# Patient Record
Sex: Male | Born: 1960 | Race: White | Hispanic: No | Marital: Married
Health system: Southern US, Community
[De-identification: ages and names within clinical notes are randomized; demographics above are authoritative.]

---

## 2020-10-31 ENCOUNTER — Emergency Department (HOSPITAL_COMMUNITY)
Admission: EM | Admit: 2020-10-31 | Discharge: 2020-10-31 | Disposition: A | Discharge status: Home / Self Care | Payer: Federal, State, Local not specified - PPO | Attending: Emergency Medicine | Admitting: Emergency Medicine

## 2020-10-31 ENCOUNTER — Other Ambulatory Visit: Payer: Self-pay

## 2020-10-31 ENCOUNTER — Emergency Department (HOSPITAL_COMMUNITY): Payer: Federal, State, Local not specified - PPO

## 2020-10-31 DIAGNOSIS — S99911A Unspecified injury of right ankle, initial encounter: Secondary | ICD-10-CM | POA: Diagnosis present

## 2020-10-31 DIAGNOSIS — S82241A Displaced spiral fracture of shaft of right tibia, initial encounter for closed fracture: Secondary | ICD-10-CM | POA: Diagnosis not present

## 2020-10-31 DIAGNOSIS — Y9301 Activity, walking, marching and hiking: Secondary | ICD-10-CM | POA: Diagnosis not present

## 2020-10-31 DIAGNOSIS — S89301A Unspecified physeal fracture of lower end of right fibula, initial encounter for closed fracture: Secondary | ICD-10-CM | POA: Diagnosis not present

## 2020-10-31 DIAGNOSIS — W541XXA Struck by dog, initial encounter: Secondary | ICD-10-CM | POA: Diagnosis not present

## 2020-10-31 DIAGNOSIS — S82831A Other fracture of upper and lower end of right fibula, initial encounter for closed fracture: Secondary | ICD-10-CM

## 2020-10-31 MED ORDER — OXYCODONE-ACETAMINOPHEN 5-325 MG PO TABS
1.0000 | ORAL_TABLET | Freq: Once | ORAL | Status: AC
  Administered 2020-10-31: 1 via ORAL
  Filled 2020-10-31: qty 1

## 2020-10-31 MED ORDER — OXYCODONE-ACETAMINOPHEN 5-325 MG PO TABS: 1.0000 | ORAL_TABLET | ORAL | 0 refills | Status: AC | PRN

## 2020-10-31 NOTE — ED Notes (Signed)
Pt resting in bed, wife at bedside. Respirations even and unlabored. Will continue to monitor.

## 2020-10-31 NOTE — Discharge Instructions (Addendum)
Follow-up with Dr. Jena Gauss.  Call his office Monday to get a close appointment.  Take prescribed pain medicine as needed.  This can make you drowsy and should not be taken while driving.  You should not have any weightbearing on your right leg.  Use crutches to get around.  Keep splint dry.  If you develop severe pain, numbness, skin color changes, return immediately to ER for reassessment.

## 2020-10-31 NOTE — ED Notes (Signed)
Ortho tech in room placing splint at this time

## 2020-10-31 NOTE — ED Provider Notes (Signed)
[facility]-EMERGENCY DEPT Provider Note   CSN: [id number] Arrival date & time: 10/31/20  1647     History Chief Complaint  Patient presents with  . Ankle Pain    Elijah Oconnell is a 59 y.o. male.  Presents to ER with concern for ankle injury.  Dog ran into him and knocked him over, twisted his right ankle.  Having excruciating pain in his right ankle, denies any other injury.  Sharp, stabbing pain.  No pain in knee or hip.  No numbness or weakness.  No head trauma.  HPI     No past medical history on file.  There are no problems to display for this patient.   No family history on file.  Social History   Tobacco Use  . Smoking status: Not on file  Substance Use Topics  . Alcohol use: Not on file  . Drug use: Not on file    Home Medications Prior to Admission medications   Medication Sig Start Date End Date Taking? Authorizing Provider  oxyCODONE-acetaminophen (PERCOCET) 5-325 MG tablet Take 1 tablet by mouth every 4 (four) hours as needed for up to 3 days for severe pain. 10/31/20 11/03/20  Milagros Loll, MD    Allergies    Patient has no allergy information on record.  Review of Systems   Review of Systems  Constitutional: Negative for chills and fever.  HENT: Negative for ear pain and sore throat.   Eyes: Negative for pain and visual disturbance.  Respiratory: Negative for cough and shortness of breath.   Cardiovascular: Negative for chest pain and palpitations.  Gastrointestinal: Negative for abdominal pain and vomiting.  Genitourinary: Negative for dysuria and hematuria.  Musculoskeletal: Positive for arthralgias. Negative for back pain.  Skin: Negative for color change and rash.  Neurological: Negative for seizures and syncope.  All other systems reviewed and are negative.   Physical Exam Updated Vital Signs BP (!) 168/69 (BP Location: Right Arm)   Pulse 90   Temp 99.5 F (37.5 C) (Oral)   Resp 18   Ht 5\' 9"  (1.753 m)    Wt 90.7 kg   SpO2 96%   BMI 29.53 kg/m   Physical Exam Vitals and nursing note reviewed.  Constitutional:      Appearance: He is well-developed.  HENT:     Head: Normocephalic and atraumatic.  Eyes:     Conjunctiva/sclera: Conjunctivae normal.  Cardiovascular:     Rate and Rhythm: Normal rate.     Pulses: Normal pulses.  Pulmonary:     Effort: Pulmonary effort is normal. No respiratory distress.  Musculoskeletal:     Cervical back: Neck supple.     Comments: Right lower extremity: Some swelling to ankle, lower lower leg, tenderness to palpation over ankle, normal sensation, motor, DP/PT pulses, no tenderness noted to knee or femur or hip  Skin:    General: Skin is warm and dry.  Neurological:     Mental Status: He is alert.     ED Results / Procedures / Treatments   Labs (all labs ordered are listed, but only abnormal results are displayed) Labs Reviewed - No data to display  EKG None  Radiology DG Ankle Complete Right  Result Date: 10/31/2020 CLINICAL DATA:  Collision with dog EXAM: RIGHT ANKLE - COMPLETE 3+ VIEW COMPARISON:  None. FINDINGS: There is a spiral fracture of the distal tibia without intra-articular extension. There is 1 cortex width of lateral displacement and 1/2 cortex width of posterior displacement  of the distal fragment. There is an oblique fracture of the distal fibula without significant displacement. There are several mildly comminuted fragments. Osteopenia. Ankle mortise appears grossly preserved. Soft tissue edema IMPRESSION: 1. Mildly displaced spiral fracture of the distal tibia. 2. Nondisplaced oblique fracture of the distal fibula. Electronically Signed   By: Meda Klinefelter MD   On: 10/31/2020 18:38    Procedures Procedures (including critical care time)  Medications Ordered in ED Medications  oxyCODONE-acetaminophen (PERCOCET/ROXICET) 5-325 MG per tablet 1 tablet (1 tablet Oral Given 10/31/20 1824)  oxyCODONE-acetaminophen  (PERCOCET/ROXICET) 5-325 MG per tablet 1 tablet (1 tablet Oral Given 10/31/20 1940)    ED Course  I have reviewed the triage vital signs and the nursing notes.  Pertinent labs & imaging results that were available during my care of the patient were reviewed by me and considered in my medical decision making (see chart for details).    MDM Rules/Calculators/A&P                         59 year old male who presented to ER after mechanical fall concern for right ankle pain.  No other trauma identified on exam.  X-ray consistent with mildly displaced spiral fracture of distal tibia, nondisplaced fracture of distal fibula.  Reviewed with on-call orthopedic surgery Dr. Jena Gauss who recommended splint, nonweightbearing, close outpatient Ortho follow-up.  Reviewed recommendations with patient, there only in [place] temporarily and will be heading home to [facility] tomorrow.  Stressed need for patient to have close orthopedic follow-up, reviewed return precautions and discharged home.   After the discussed management above, the patient was determined to be safe for discharge.  The patient was in agreement with this plan and all questions regarding their care were answered.  ED return precautions were discussed and the patient will return to the ED with any significant worsening of condition.  Final Clinical Impression(s) / ED Diagnoses Final diagnoses:  Closed displaced spiral fracture of shaft of right tibia, initial encounter  Closed fracture of distal end of right fibula, unspecified fracture morphology, initial encounter    Rx / DC Orders ED Discharge Orders         Ordered    oxyCODONE-acetaminophen (PERCOCET) 5-325 MG tablet  Every 4 hours PRN        10/31/20 1926           Milagros Loll, MD 10/31/20 2252

## 2020-10-31 NOTE — ED Triage Notes (Signed)
Per [facility] pt from home. Walking out the door and the dog ran into him and knocked him down. 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

## 2021-10-07 IMAGING — CR DG ANKLE COMPLETE 3+V*R*
3 series · 3 of 3 positions shown · non-contrast
Comparison: None.

CLINICAL DATA: Collision with dog

EXAM:
RIGHT ANKLE - COMPLETE 3+ VIEW

[x ankle ap right]
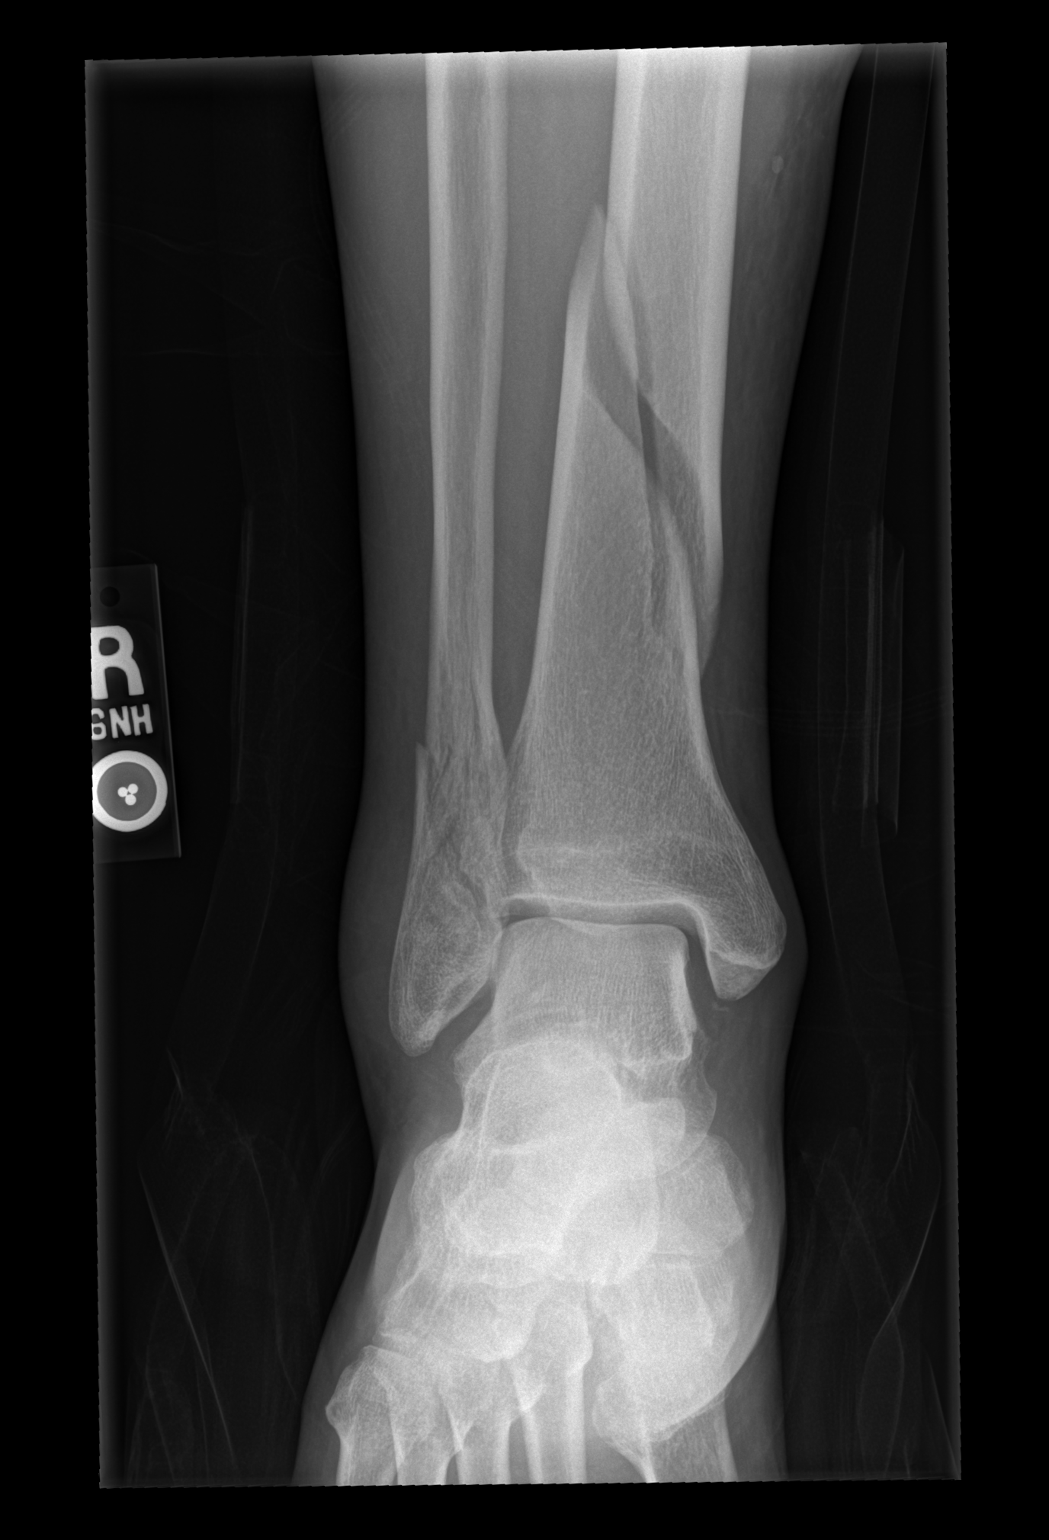

[x ankle obl right]
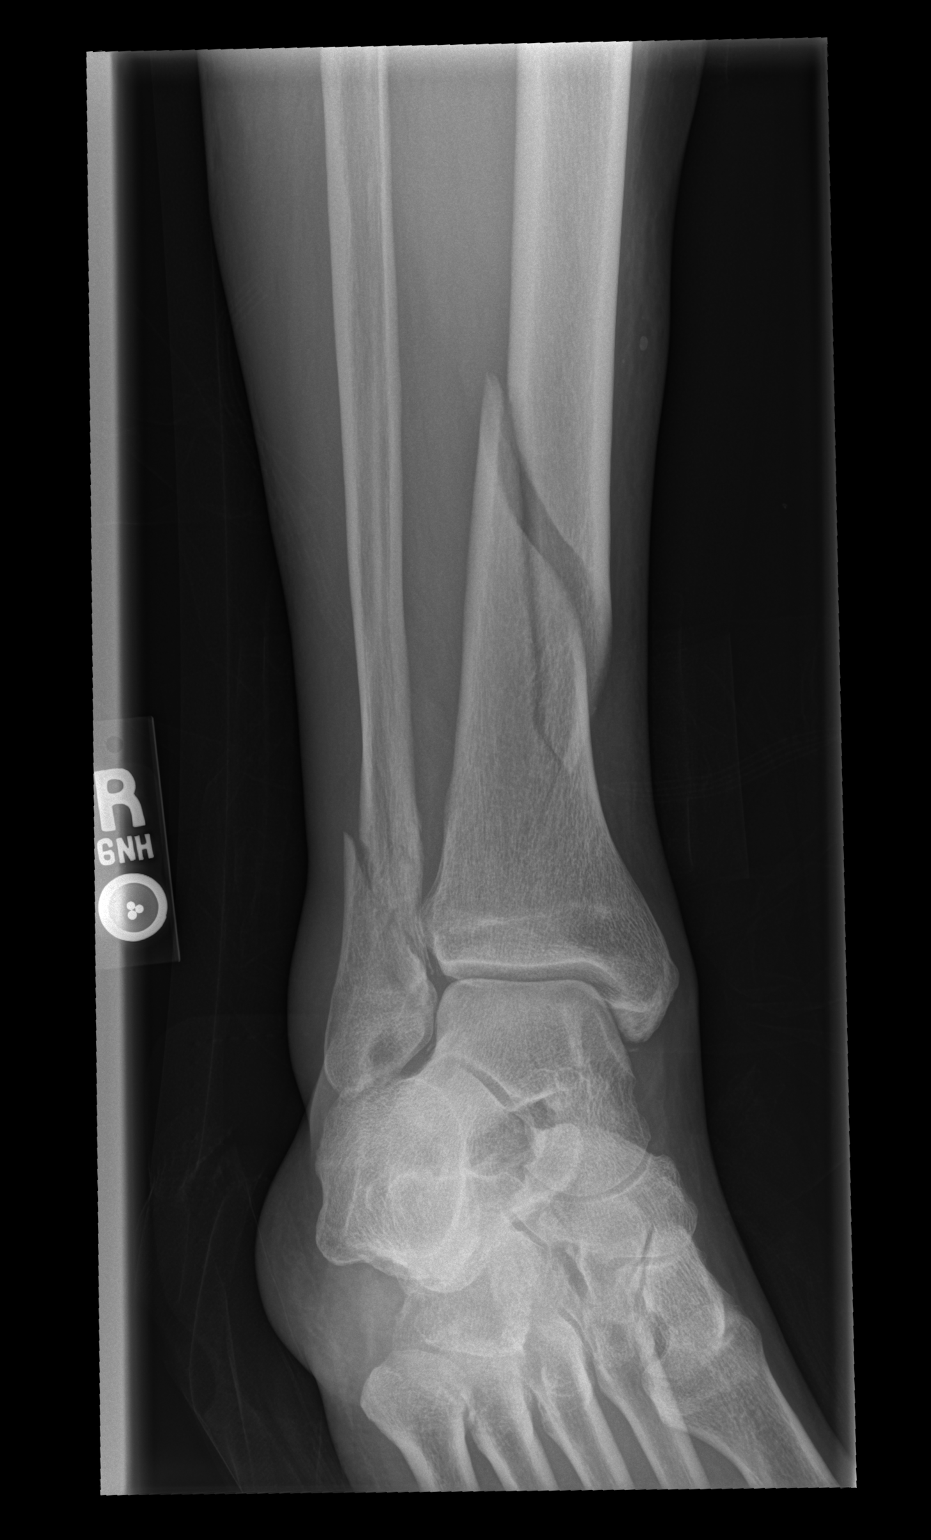

[x ankle lat right]
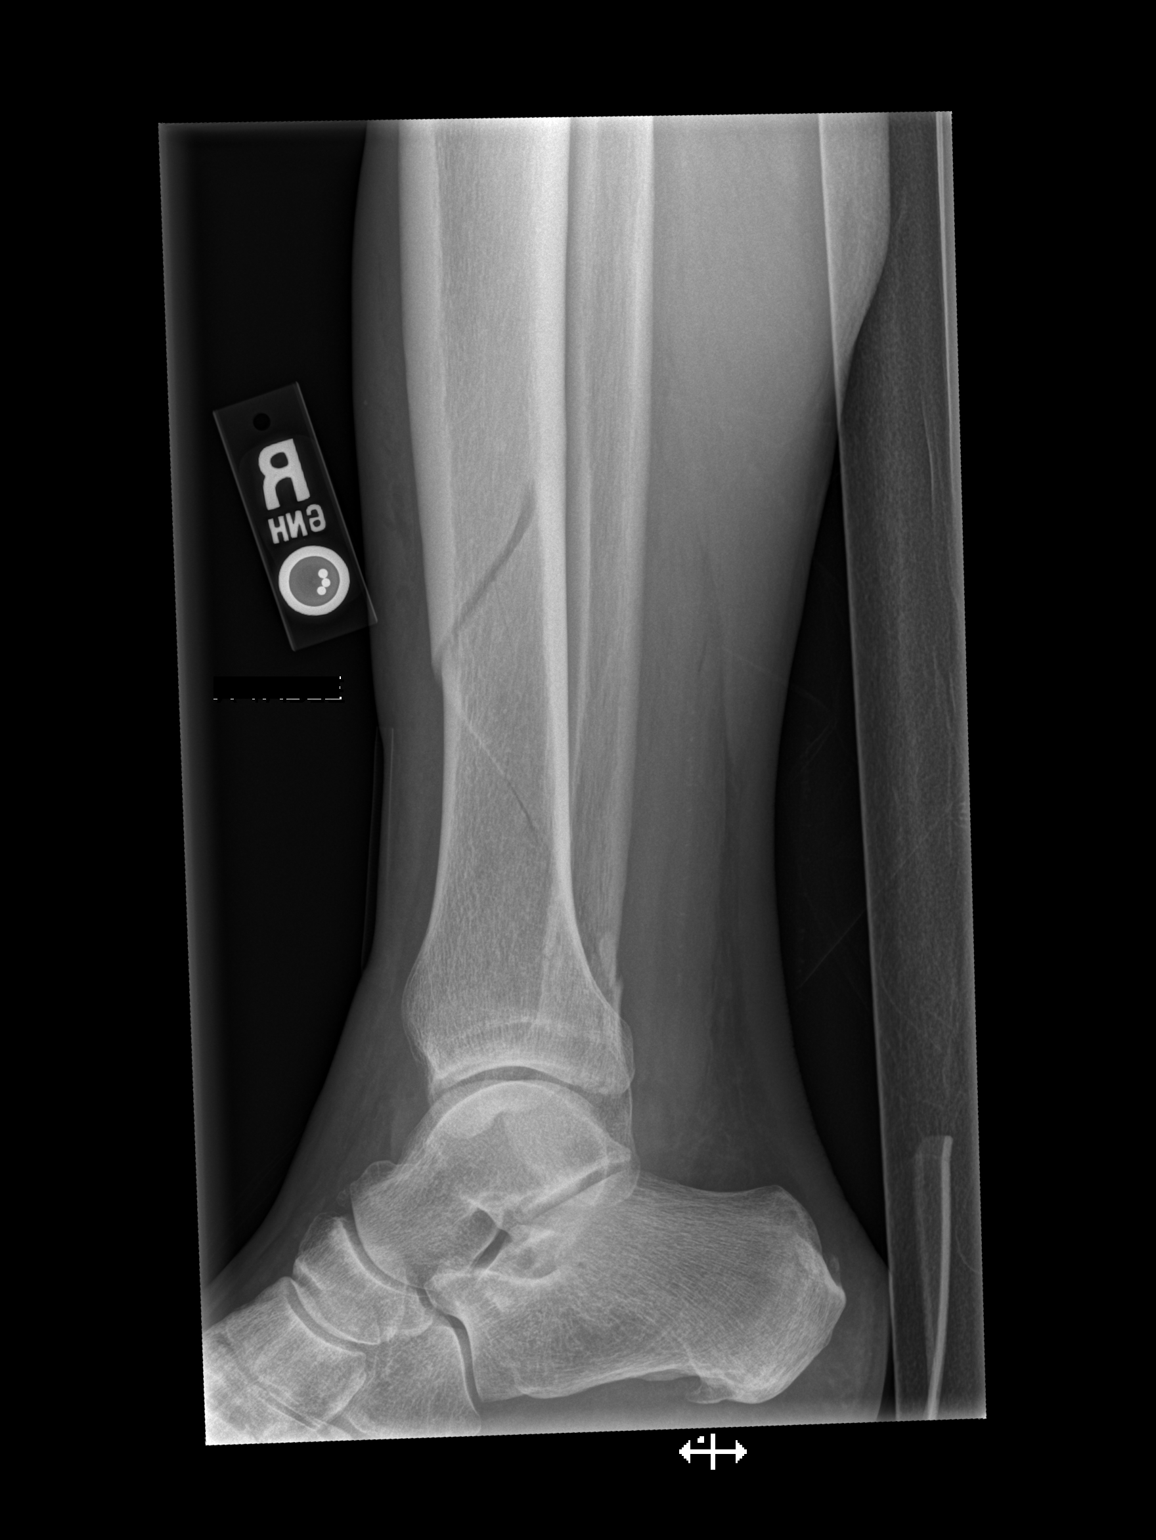

[3 of 3 positions shown; findings below may reference images not displayed]

FINDINGS: There is a spiral fracture of the distal tibia without
intra-articular extension. There is 1 cortex width of lateral
displacement and [DATE] cortex width of posterior displacement of the
distal fragment. There is an oblique fracture of the distal fibula
without significant displacement. There are several mildly
comminuted fragments. Osteopenia. Ankle mortise appears grossly
preserved. Soft tissue edema
IMPRESSION: 1. Mildly displaced spiral fracture of the distal tibia.
2. Nondisplaced oblique fracture of the distal fibula.
